# Patient Record
Sex: Female | Born: 1995 | Race: White | Hispanic: No | Marital: Single
Health system: Southern US, Community
[De-identification: ages and names within clinical notes are randomized; demographics above are authoritative.]

## PROBLEM LIST (undated history)

## (undated) DIAGNOSIS — N200 Calculus of kidney: Secondary | ICD-10-CM

## (undated) DIAGNOSIS — Q614 Renal dysplasia: Secondary | ICD-10-CM

## (undated) HISTORY — DX: Renal dysplasia: Q61.4

## (undated) HISTORY — DX: Calculus of kidney: N20.0

---
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
## 2017-07-29 ENCOUNTER — Encounter: Payer: Self-pay | Admitting: Certified Nurse Midwife

## 2017-07-29 ENCOUNTER — Ambulatory Visit (INDEPENDENT_AMBULATORY_CARE_PROVIDER_SITE_OTHER): Payer: 59 | Admitting: Certified Nurse Midwife

## 2017-07-29 VITALS — BP 104/66 | HR 71 | Ht 63.0 in | Wt 123.4 lb

## 2017-07-29 DIAGNOSIS — Z01419 Encounter for gynecological examination (general) (routine) without abnormal findings: Secondary | ICD-10-CM

## 2017-07-29 MED ORDER — NORETHIN ACE-ETH ESTRAD-FE 1-20 MG-MCG PO TABS: 1.0000 | ORAL_TABLET | Freq: Every day | ORAL | 11 refills | Status: DC

## 2017-07-29 NOTE — Patient Instructions (Signed)
Ethinyl Estradiol; Norethindrone Acetate; Ferrous fumarate tablets or capsules What is this medicine? ETHINYL ESTRADIOL; NORETHINDRONE ACETATE; FERROUS FUMARATE (ETH in il es tra DYE ole; nor eth IN drone AS e tate; FER us FUE ma rate) is an oral contraceptive. The products combine two types of female hormones, an estrogen and a progestin. They are used to prevent ovulation and pregnancy. Some products are also used to treat acne in females. This medicine may be used for other purposes; ask your health care provider or pharmacist if you have questions. COMMON BRAND NAME(S): Blisovi 24 Fe, Blisovi Fe, Estrostep Fe, Gildess 24 Fe, Gildess Fe 1.5/30, Gildess Fe 1/20, Junel Fe 1.5/30, Junel Fe 1/20, Junel Fe 24, Larin Fe, Lo Loestrin Fe, Loestrin 24 Fe, Loestrin FE 1.5/30, Loestrin FE 1/20, Lomedia 24 Fe, Microgestin 24 Fe, Microgestin Fe 1.5/30, Microgestin Fe 1/20, Tarina Fe 1/20, Taytulla, Tilia Fe, Tri-Legest Fe What should I tell my health care provider before I take this medicine? They need to know if you have any of these conditions: -abnormal vaginal bleeding -blood vessel disease -breast, cervical, endometrial, ovarian, liver, or uterine cancer -diabetes -gallbladder disease -heart disease or recent heart attack -high blood pressure -high cholesterol -history of blood clots -kidney disease -liver disease -migraine headaches -smoke tobacco -stroke -systemic lupus erythematosus (SLE) -an unusual or allergic reaction to estrogens, progestins, other medicines, foods, dyes, or preservatives -pregnant or trying to get pregnant -breast-feeding How should I use this medicine? Take this medicine by mouth. To reduce nausea, this medicine may be taken with food. Follow the directions on the prescription label. Take this medicine at the same time each day and in the order directed on the package. Do not take your medicine more often than directed. A patient package insert for the product will be  given with each prescription and refill. Read this sheet carefully each time. The sheet may change frequently. Contact your pediatrician regarding the use of this medicine in children. Special care may be needed. This medicine has been used in female children who have started having menstrual periods. Overdosage: If you think you have taken too much of this medicine contact a poison control center or emergency room at once. NOTE: This medicine is only for you. Do not share this medicine with others. What if I miss a dose? If you miss a dose, refer to the patient information sheet you received with your medicine for direction. If you miss more than one pill, this medicine may not be as effective and you may need to use another form of birth control. What may interact with this medicine? Do not take this medicine with the following medication: -dasabuvir; ombitasvir; paritaprevir; ritonavir -ombitasvir; paritaprevir; ritonavir This medicine may also interact with the following medications: -acetaminophen -antibiotics or medicines for infections, especially rifampin, rifabutin, rifapentine, and griseofulvin, and possibly penicillins or tetracyclines -aprepitant -ascorbic acid (vitamin C) -atorvastatin -barbiturate medicines, such as phenobarbital -bosentan -carbamazepine -caffeine -clofibrate -cyclosporine -dantrolene -doxercalciferol -felbamate -grapefruit juice -hydrocortisone -medicines for anxiety or sleeping problems, such as diazepam or temazepam -medicines for diabetes, including pioglitazone -mineral oil -modafinil -mycophenolate -nefazodone -oxcarbazepine -phenytoin -prednisolone -ritonavir or other medicines for HIV infection or AIDS -rosuvastatin -selegiline -soy isoflavones supplements -St. John's wort -tamoxifen or raloxifene -theophylline -thyroid hormones -topiramate -warfarin This list may not describe all possible interactions. Give your health care  provider a list of all the medicines, herbs, non-prescription drugs, or dietary supplements you use. Also tell them if you smoke, drink alcohol, or use illegal drugs. Some   items may interact with your medicine. What should I watch for while using this medicine? Visit your doctor or health care professional for regular checks on your progress. You will need a regular breast and pelvic exam and Pap smear while on this medicine. Use an additional method of contraception during the first cycle that you take these tablets. If you have any reason to think you are pregnant, stop taking this medicine right away and contact your doctor or health care professional. If you are taking this medicine for hormone related problems, it may take several cycles of use to see improvement in your condition. Smoking increases the risk of getting a blood clot or having a stroke while you are taking birth control pills, especially if you are more than 21 years old. You are strongly advised not to smoke. This medicine can make your body retain fluid, making your fingers, hands, or ankles swell. Your blood pressure can go up. Contact your doctor or health care professional if you feel you are retaining fluid. This medicine can make you more sensitive to the sun. Keep out of the sun. If you cannot avoid being in the sun, wear protective clothing and use sunscreen. Do not use sun lamps or tanning beds/booths. If you wear contact lenses and notice visual changes, or if the lenses begin to feel uncomfortable, consult your eye care specialist. In some women, tenderness, swelling, or minor bleeding of the gums may occur. Notify your dentist if this happens. Brushing and flossing your teeth regularly may help limit this. See your dentist regularly and inform your dentist of the medicines you are taking. If you are going to have elective surgery, you may need to stop taking this medicine before the surgery. Consult your health care  professional for advice. This medicine does not protect you against HIV infection (AIDS) or any other sexually transmitted diseases. What side effects may I notice from receiving this medicine? Side effects that you should report to your doctor or health care professional as soon as possible: -allergic reactions like skin rash, itching or hives, swelling of the face, lips, or tongue -breast tissue changes or discharge -changes in vaginal bleeding during your period or between your periods -changes in vision -chest pain -confusion -coughing up blood -dizziness -feeling faint or lightheaded -headaches or migraines -leg, arm or groin pain -loss of balance or coordination -severe or sudden headaches -stomach pain (severe) -sudden shortness of breath -sudden numbness or weakness of the face, arm or leg -symptoms of vaginal infection like itching, irritation or unusual discharge -tenderness in the upper abdomen -trouble speaking or understanding -vomiting -yellowing of the eyes or skin Side effects that usually do not require medical attention (report to your doctor or health care professional if they continue or are bothersome): -breakthrough bleeding and spotting that continues beyond the 3 initial cycles of pills -breast tenderness -mood changes, anxiety, depression, frustration, anger, or emotional outbursts -increased sensitivity to sun or ultraviolet light -nausea -skin rash, acne, or brown spots on the skin -weight gain (slight) This list may not describe all possible side effects. Call your doctor for medical advice about side effects. You may report side effects to FDA at 1-800-FDA-1088. Where should I keep my medicine? Keep out of the reach of children. Store at room temperature between 15 and 30 degrees C (59 and 86 degrees F). Throw away any unused medicine after the expiration date. NOTE: This sheet is a summary. It may not cover all possible information. If you   have  questions about this medicine, talk to your doctor, pharmacist, or health care provider.  2018 Elsevier/Gold Standard (2015-12-01 08:04:41) Preventive Care 18-39 Years, Female Preventive care refers to lifestyle choices and visits with your health care provider that can promote health and wellness. What does preventive care include?  A yearly physical exam. This is also called an annual well check.  Dental exams once or twice a year.  Routine eye exams. Ask your health care provider how often you should have your eyes checked.  Personal lifestyle choices, including: ? Daily care of your teeth and gums. ? Regular physical activity. ? Eating a healthy diet. ? Avoiding tobacco and drug use. ? Limiting alcohol use. ? Practicing safe sex. ? Taking vitamin and mineral supplements as recommended by your health care provider. What happens during an annual well check? The services and screenings done by your health care provider during your annual well check will depend on your age, overall health, lifestyle risk factors, and family history of disease. Counseling Your health care provider may ask you questions about your:  Alcohol use.  Tobacco use.  Drug use.  Emotional well-being.  Home and relationship well-being.  Sexual activity.  Eating habits.  Work and work environment.  Method of birth control.  Menstrual cycle.  Pregnancy history.  Screening You may have the following tests or measurements:  Height, weight, and BMI.  Diabetes screening. This is done by checking your blood sugar (glucose) after you have not eaten for a while (fasting).  Blood pressure.  Lipid and cholesterol levels. These may be checked every 5 years starting at age 20.  Skin check.  Hepatitis C blood test.  Hepatitis B blood test.  Sexually transmitted disease (STD) testing.  BRCA-related cancer screening. This may be done if you have a family history of breast, ovarian, tubal, or  peritoneal cancers.  Pelvic exam and Pap test. This may be done every 3 years starting at age 21. Starting at age 30, this may be done every 5 years if you have a Pap test in combination with an HPV test.  Discuss your test results, treatment options, and if necessary, the need for more tests with your health care provider. Vaccines Your health care provider may recommend certain vaccines, such as:  Influenza vaccine. This is recommended every year.  Tetanus, diphtheria, and acellular pertussis (Tdap, Td) vaccine. You may need a Td booster every 10 years.  Varicella vaccine. You may need this if you have not been vaccinated.  HPV vaccine. If you are 26 or younger, you may need three doses over 6 months.  Measles, mumps, and rubella (MMR) vaccine. You may need at least one dose of MMR. You may also need a second dose.  Pneumococcal 13-valent conjugate (PCV13) vaccine. You may need this if you have certain conditions and were not previously vaccinated.  Pneumococcal polysaccharide (PPSV23) vaccine. You may need one or two doses if you smoke cigarettes or if you have certain conditions.  Meningococcal vaccine. One dose is recommended if you are age 19-21 years and a first-year college student living in a residence hall, or if you have one of several medical conditions. You may also need additional booster doses.  Hepatitis A vaccine. You may need this if you have certain conditions or if you travel or work in places where you may be exposed to hepatitis A.  Hepatitis B vaccine. You may need this if you have certain conditions or if you travel or work in   places where you may be exposed to hepatitis B.  Haemophilus influenzae type b (Hib) vaccine. You may need this if you have certain risk factors.  Talk to your health care provider about which screenings and vaccines you need and how often you need them. This information is not intended to replace advice given to you by your health care  provider. Make sure you discuss any questions you have with your health care provider. Document Released: 05/18/2001 Document Revised: 12/10/2015 Document Reviewed: 01/21/2015 Elsevier Interactive Patient Education  2018 Elsevier Inc.  

## 2017-07-29 NOTE — Progress Notes (Signed)
ANNUAL PREVENTATIVE CARE GYN  ENCOUNTER NOTE  Subjective:       Tonya Ulanda EdisonMarie Hartsfield is a 22 y.o. G0P0000 female here for a routine annual gynecologic exam. No current complaints. Desires Junel 1/20 refill.   Prefers Mady. 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 In a committment relationship with her boyfriend of two (2) and a half years. Declines STI testing.   Denies difficulty breathing or respiratory distress, chest pain, abdominal pain, excessive vaginal bleeding, dysuria, and leg pain or swelling.    Gynecologic History  Patient's last menstrual period was 07/17/2017.  Contraception: OCP (estrogen/progesterone)  Last Pap: N/A.  Obstetric History OB History  Gravida Para Term Preterm AB Living  0 0 0 0 0 0  SAB TAB Ectopic Multiple Live Births  0 0 0 0 0    Past Medical History:  Diagnosis Date  . Cystic dysplasia of one kidney   . Kidney stone     History reviewed. No pertinent surgical history.  Current Outpatient Medications on File Prior to Visit  Medication Sig Dispense Refill  . norethindrone-ethinyl estradiol (JUNEL FE,GILDESS FE,LOESTRIN FE) 1-20 MG-MCG tablet Take 1 tablet by mouth daily.    . clindamycin-benzoyl peroxide (BENZACLIN WITH PUMP) gel Apply 1 application topically 2 (two) times daily.    Marland Kitchen. HYDROcodone-acetaminophen (NORCO/VICODIN) 5-325 MG per tablet Take 1 tablet by mouth every 4 (four) hours as needed. (Patient not taking: Reported on 07/29/2017) 15 tablet 0   No current facility-administered medications on file prior to visit.     No Known Allergies  Social History   Socioeconomic History  . Marital status: In a relationship    Spouse name: Not on file  . Number of children: Not on file  . Years of education: Not on file  . Highest education level: Not on file  Occupational History  . Not on file  Social Needs  . Financial resource strain: Not on file  . Food insecurity:    Worry: Not on file    Inability: Not on file  . Transportation needs:     Medical: Not on file    Non-medical: Not on file  Tobacco Use  . Smoking status: Passive Smoke Exposure - Never Smoker  . Smokeless tobacco: Never Used  Substance and Sexual Activity  . Alcohol use: Yes    Comment: occas  . Drug use: No  . Sexual activity: Yes    Birth control/protection: Pill, Condom  Lifestyle  . Physical activity:    Days per week: Not on file    Minutes per session: Not on file  . Stress: Not on file  Relationships  . Social connections:    Talks on phone: Not on file    Gets together: Not on file    Attends religious service: Not on file    Active member of club or organization: Not on file    Attends meetings of clubs or organizations: Not on file    Relationship status: Not on file  . Intimate partner violence:    Fear of current or ex partner: Not on file    Emotionally abused: Not on file    Physically abused: Not on file    Forced sexual activity: Not on file  Other Topics Concern  . Not on file  Social History Narrative  . Not on file    Family History  Problem Relation Age of Onset  . Graves' disease Father     The following portions of the patient's history were reviewed  and updated as appropriate: allergies, current medications, past family history, past medical history, past social history, past surgical history and problem list.  Review of Systems  ROS negative except as noted above. Information obtained from patient.    Objective:   BP 104/66   Pulse 71   Ht 5\' 3"  (1.6 m)   Wt 123 lb 6.4 oz (56 kg)   LMP 07/17/2017   BMI 21.86 kg/m   CONSTITUTIONAL: Well-developed, well-nourished female in no acute distress.   PSYCHIATRIC: Normal mood and affect. Normal behavior. Normal judgment and thought content.  NEUROLGIC: Alert and oriented to person, place, and time. Normal muscle tone coordination. No cranial nerve deficit noted.  HENT:  Normocephalic, atraumatic, External right and left ear normal.   EYES: Conjunctivae and  EOM are normal. Pupils are equal and round.   NECK: Normal range of motion, supple, no masses.  Normal thyroid.   SKIN: Skin is warm and dry. No rash noted. Not diaphoretic. No erythema. No pallor.  CARDIOVASCULAR: Normal heart rate noted, regular rhythm, no murmur.  RESPIRATORY: Clear to auscultation bilaterally. Effort and breath sounds normal, no problems with respiration noted.  BREASTS: Symmetric in size. No masses, skin changes, nipple drainage, or lymphadenopathy.  ABDOMEN: Soft, normal bowel sounds, no distention noted.  No tenderness, rebound or guarding.   PELVIC:  External Genitalia: Normal  Vagina: Normal  Cervix: Normal  Uterus: Normal  Adnexa: Normal   MUSCULOSKELETAL: Normal range of motion. No tenderness.  No cyanosis, clubbing, or edema.  2+ distal pulses.  LYMPHATIC: No Axillary, Supraclavicular, or Inguinal Adenopathy.  Assessment:   Annual gynecologic examination 22 y.o.   Contraception: OCP (estrogen/progesterone)   Normal BMI   Problem List Items Addressed This Visit    None    Visit Diagnoses    Well woman exam    -  Primary      Plan:   Pap: Not needed  Labs: Declined   Routine preventative health maintenance measures emphasized: Exercise/Diet/Weight control, Tobacco Warnings, Alcohol/Substance use risks, Stress Management, Peer Pressure Issues and Safe Sex; see AVS  Rx: Junel, see orders.   Reviewed red flag symptoms and when to call.   RTC x  1 year for Annual Exam or sooner if needed.    Gunnar Bulla, CNM [facility], [facility]

## 2017-07-29 NOTE — Progress Notes (Signed)
Pt is well no concerns no itching, burning or discharge.

## 2018-07-13 ENCOUNTER — Other Ambulatory Visit: Payer: Self-pay | Admitting: Certified Nurse Midwife

## 2018-07-26 ENCOUNTER — Other Ambulatory Visit: Payer: Self-pay

## 2018-07-26 MED ORDER — NORETHIN ACE-ETH ESTRAD-FE 1-20 MG-MCG PO TABS: 1.0000 | ORAL_TABLET | Freq: Every day | ORAL | 1 refills | Status: AC

## 2018-07-26 NOTE — Telephone Encounter (Signed)
Refill sent.

## 2018-07-31 ENCOUNTER — Encounter: Payer: 59 | Admitting: Certified Nurse Midwife

## 2018-09-19 ENCOUNTER — Encounter: Payer: 59 | Admitting: Certified Nurse Midwife

## 2019-07-27 ENCOUNTER — Other Ambulatory Visit: Payer: Self-pay | Admitting: Certified Nurse Midwife

## 2024-06-08 ENCOUNTER — Ambulatory Visit: Admitting: Cardiology

## 2024-07-20 ENCOUNTER — Inpatient Hospital Stay (HOSPITAL_COMMUNITY): Admit: 2024-07-20 | Admitting: Obstetrics & Gynecology
